# Patient Record
Sex: Female | Born: 1980 | Race: White | Hispanic: No | Marital: Married
Health system: Southern US, Community
[De-identification: ages and names within clinical notes are randomized; demographics above are authoritative.]

---

## 2013-07-10 ENCOUNTER — Emergency Department (INDEPENDENT_AMBULATORY_CARE_PROVIDER_SITE_OTHER): Payer: Medicare Other

## 2013-07-10 ENCOUNTER — Emergency Department (INDEPENDENT_AMBULATORY_CARE_PROVIDER_SITE_OTHER)
Admission: EM | Admit: 2013-07-10 | Discharge: 2013-07-10 | Disposition: A | Discharge status: Home / Self Care | Payer: Medicare Other | Source: Home / Self Care | Attending: Emergency Medicine | Admitting: Emergency Medicine

## 2013-07-10 ENCOUNTER — Encounter (HOSPITAL_COMMUNITY): Payer: Self-pay | Admitting: Emergency Medicine

## 2013-07-10 DIAGNOSIS — S93409A Sprain of unspecified ligament of unspecified ankle, initial encounter: Secondary | ICD-10-CM

## 2013-07-10 DIAGNOSIS — S93401A Sprain of unspecified ligament of right ankle, initial encounter: Secondary | ICD-10-CM

## 2013-07-10 NOTE — ED Provider Notes (Signed)
Medical screening examination/treatment/procedure(s) were performed by non-physician practitioner and as supervising physician I was immediately available for consultation/collaboration.  Kenyetta Wimbish, M.D.  Lacy Sofia C Jary Louvier, MD 07/10/13 2156 

## 2013-07-10 NOTE — ED Provider Notes (Signed)
CSN: [id number]     Arrival date & time 07/10/13  1533 History   First MD Initiated Contact with Patient 07/10/13 1649     Chief Complaint  Patient presents with  . Ankle Pain   (Consider location/radiation/quality/duration/timing/severity/associated sxs/prior Treatment) Patient is a 33 y.o. female presenting with ankle pain. The history is provided by the patient.  Ankle Pain Location:  Ankle and foot Time since incident:  1 hour Injury: yes   Mechanism of injury: fall   Mechanism of injury comment:  Missed last step while walking down set of stairs and stumbled, twisting her right foot and ankle Ankle location:  R ankle Foot location:  R foot Pain details:    Quality:  Aching   Radiates to:  Does not radiate   Severity:  Moderate   Onset quality:  Sudden   Duration:  1 hour Chronicity:  New Dislocation: no   Foreign body present:  No foreign bodies Worsened by:  Bearing weight Associated symptoms: stiffness and swelling   Associated symptoms: no numbness and no tingling     History reviewed. No pertinent past medical history. No past surgical history on file. No family history on file. History  Substance Use Topics  . Smoking status: Not on file  . Smokeless tobacco: Not on file  . Alcohol Use: Not on file   OB History   Grav Para Term Preterm Abortions TAB SAB Ect Mult Living                 Review of Systems  Musculoskeletal: Positive for stiffness.  All other systems reviewed and are negative.    Allergies  Review of patient's allergies indicates no known allergies.  Home Medications  No current outpatient prescriptions on file. BP 130/89  Pulse 68  Temp(Src) 98.3 F (36.8 C) (Oral)  Resp 16  SpO2 100% Physical Exam  Nursing note and vitals reviewed. Constitutional: She is oriented to person, place, and time. She appears well-developed and well-nourished. No distress.  HENT:  Head: Normocephalic and atraumatic.  Eyes: Conjunctivae are normal.   Cardiovascular: Normal rate.   Pulmonary/Chest: Effort normal.  Musculoskeletal:       Right ankle: She exhibits decreased range of motion, swelling and ecchymosis. She exhibits no deformity, no laceration and normal pulse. Tenderness. Lateral malleolus tenderness found. No medial malleolus tenderness found. Achilles tendon normal.  CSM exam of right foot intact. +mild tenderness to palpation at right proximal tibia  Neurological: She is alert and oriented to person, place, and time.  Skin: Skin is warm and dry.  +intact  Psychiatric: She has a normal mood and affect. Her behavior is normal.    ED Course  Procedures (including critical care time) Labs Review Labs Reviewed - No data to display Imaging Review Dg Tibia/fibula Right  07/10/2013   CLINICAL DATA:  Pain and soft tissue swelling in the lateral right foot. Pain with palpation in the proximal tibia and fibula.  EXAM: RIGHT TIBIA AND FIBULA - 2 VIEW  COMPARISON:  None.  FINDINGS: The proximal tibia and fibula appear within normal limits. Grossly, the proximal tibia-fibular joint is normal. There is no fracture of the tibia or fibula.  IMPRESSION: Negative.   Electronically Signed   By: Andreas NewportGeoffrey  Lamke M.D.   On: 07/10/2013 16:58   Dg Foot Complete Right  07/10/2013   CLINICAL DATA:  Pain and soft tissue swelling of the right foot.  EXAM: RIGHT FOOT COMPLETE - 3+ VIEW  COMPARISON:  None.  FINDINGS: Anatomic alignment of bones of the right foot. There is no fracture. Soft tissues appear within normal limits. Fifth metatarsal base appears intact.  IMPRESSION: No acute osseous abnormality.   Electronically Signed   By: Andreas Newport M.D.   On: 07/10/2013 16:58     MDM   1. Right ankle sprain    ASO & Crutches provided at [facility] with ice pack. Instructions regarding splint use, RICE therapy and ibuprofen as directed on packaging for pain. Ortho follow up (Dr. Roda Shutters) if no improvement over next 1-2 weeks.   Jess Barters [place],  [place] 07/10/13 [phone]

## 2013-07-10 NOTE — ED Notes (Signed)
C/o right ankle pain States she was going down stairs when she missed a step and fall States she fall on gravel Feels as if she twisted ankle States she has broken an ankle bone before.

## 2013-07-10 NOTE — Discharge Instructions (Signed)

## 2015-08-27 IMAGING — CR DG FOOT COMPLETE 3+V*R*
3 series · 3 of 3 positions shown · non-contrast
Comparison: None.

CLINICAL DATA: Pain and soft tissue swelling of the right foot.

EXAM:
RIGHT FOOT COMPLETE - 3+ VIEW

[view not recorded (1 of 3)]
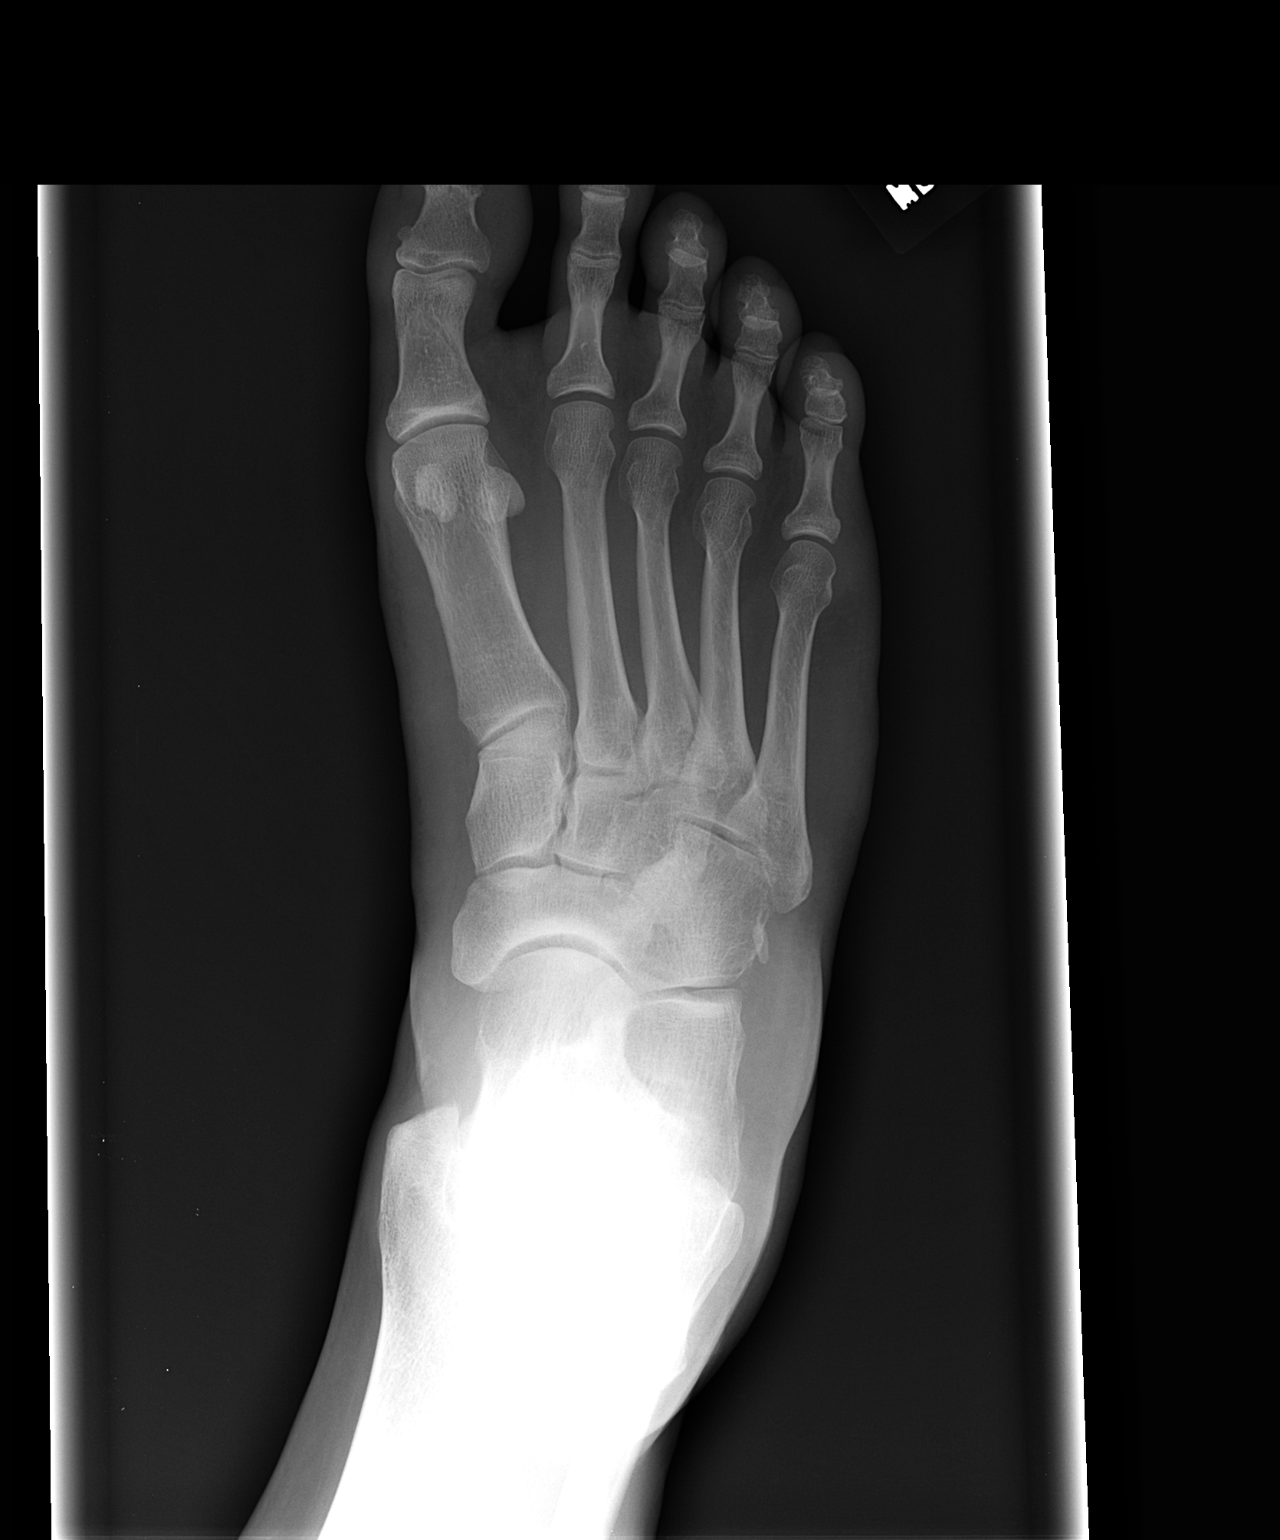

[view not recorded (2 of 3)]
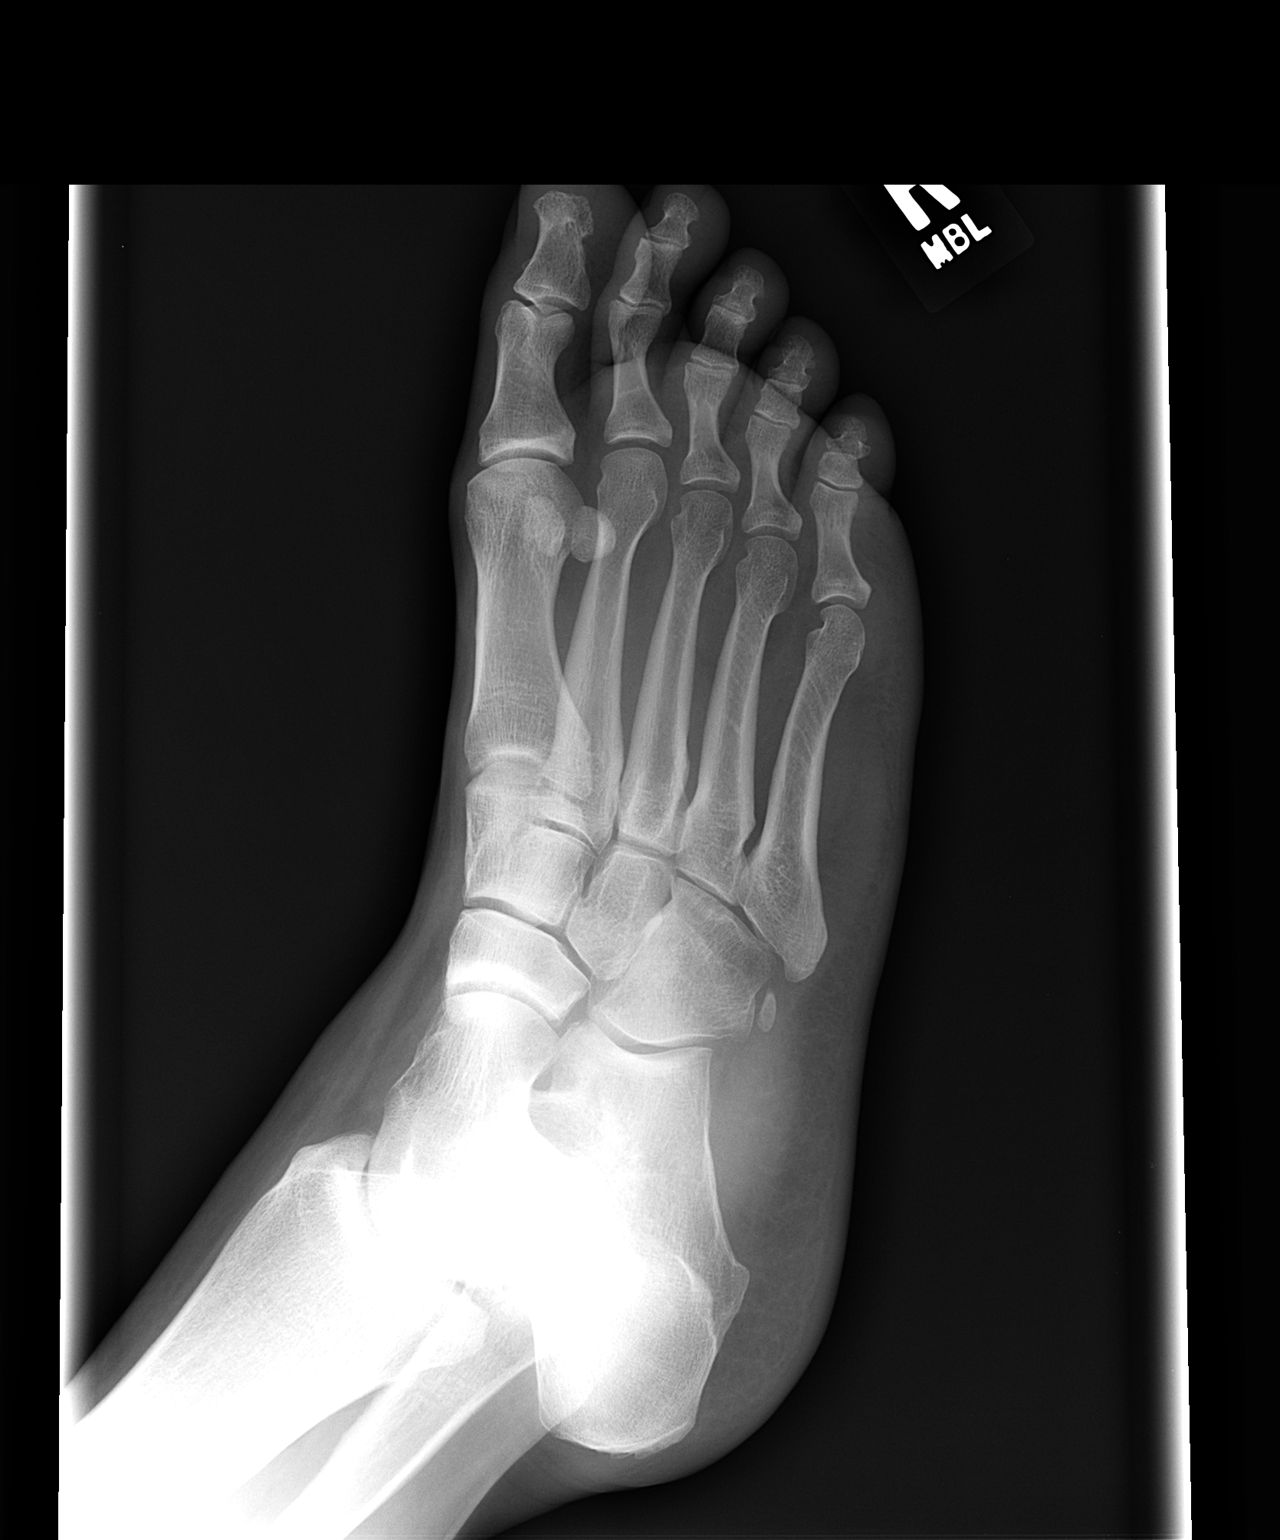

[view not recorded (3 of 3)]
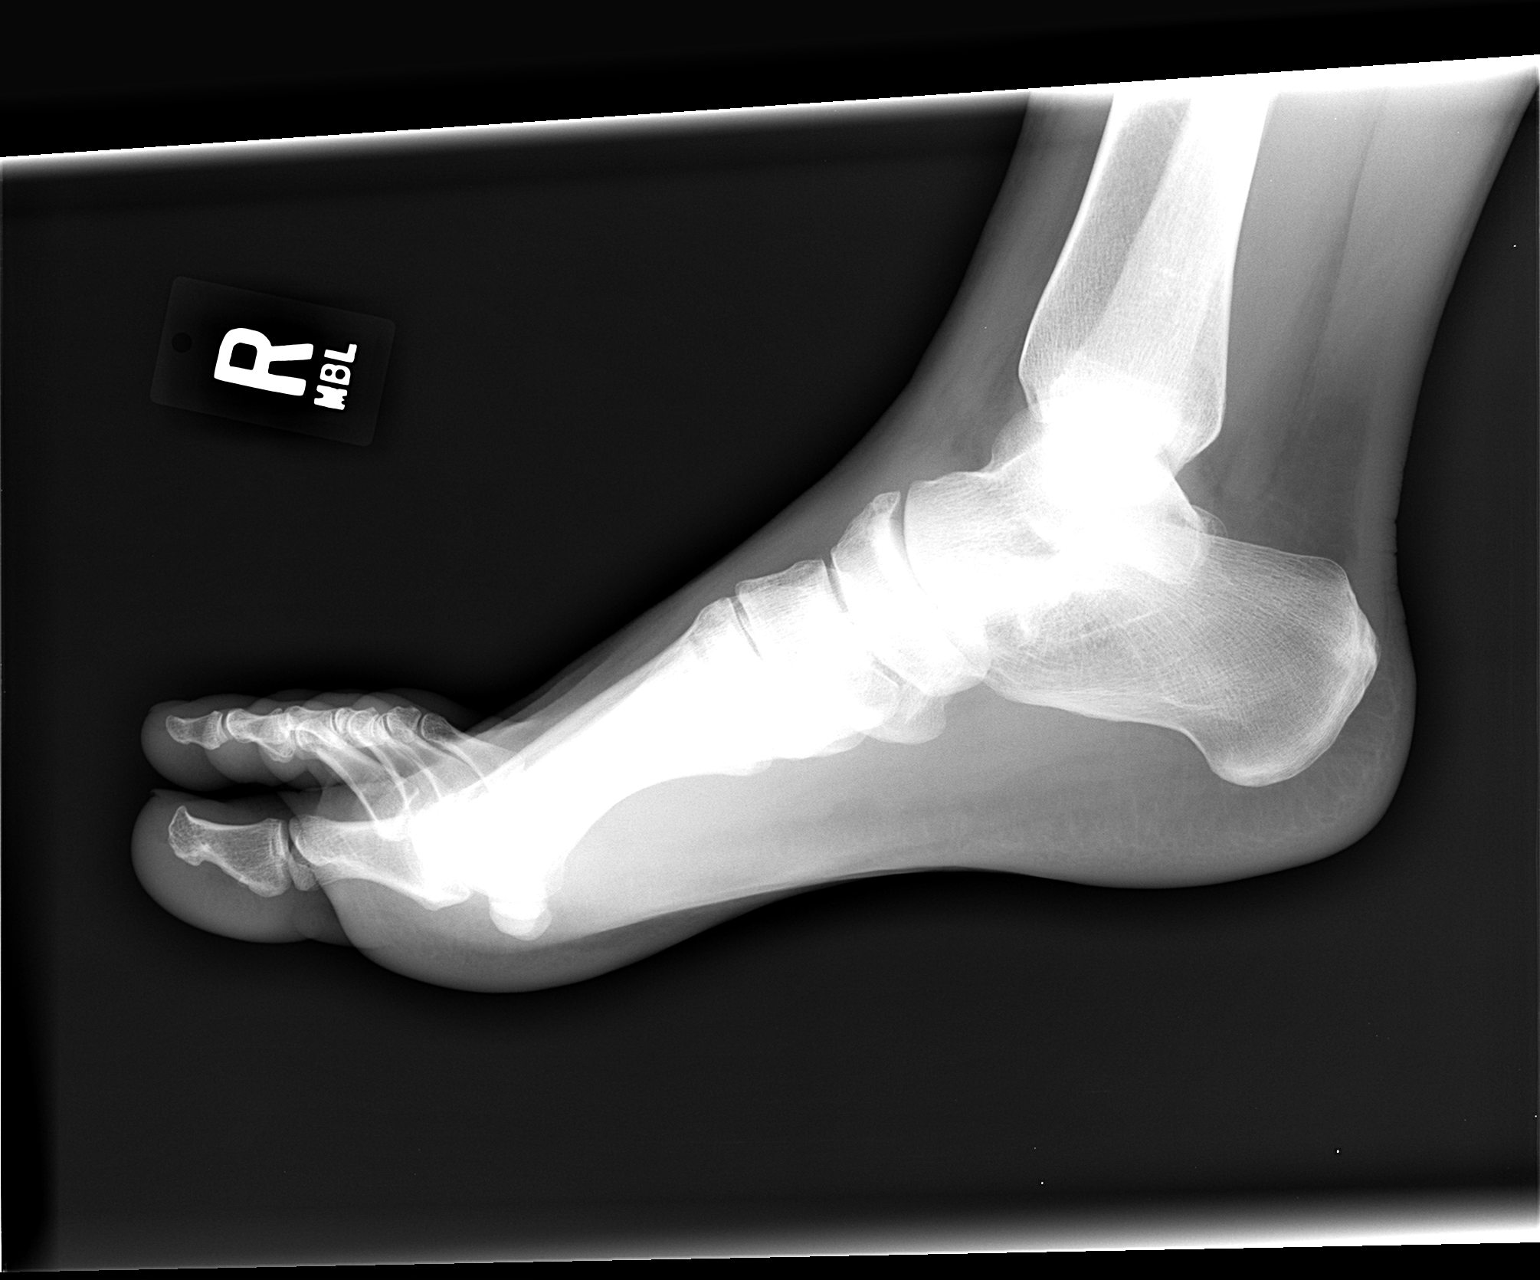

[3 of 3 positions shown; findings below may reference images not displayed]

FINDINGS: Anatomic alignment of bones of the right foot. There is no fracture.
Soft tissues appear within normal limits. Fifth metatarsal base
appears intact.
IMPRESSION: No acute osseous abnormality.
